# Patient Record
Sex: Male | Born: 1980 | Race: Black or African American | Hispanic: No | Marital: Single
Health system: Southern US, Community
[De-identification: ages and names within clinical notes are randomized; demographics above are authoritative.]

---

## 2006-07-18 ENCOUNTER — Ambulatory Visit (HOSPITAL_COMMUNITY): Admission: RE | Admit: 2006-07-18 | Discharge: 2006-07-18 | Payer: Self-pay | Admitting: Family Medicine

## 2017-04-26 ENCOUNTER — Emergency Department (HOSPITAL_COMMUNITY): Payer: No Typology Code available for payment source

## 2017-04-26 ENCOUNTER — Emergency Department (HOSPITAL_COMMUNITY)
Admission: EM | Admit: 2017-04-26 | Discharge: 2017-04-26 | Disposition: A | Discharge status: Other Acute Inpatient Hospital | Payer: No Typology Code available for payment source | Attending: Emergency Medicine | Admitting: Emergency Medicine

## 2017-04-26 DIAGNOSIS — Y939 Activity, unspecified: Secondary | ICD-10-CM | POA: Insufficient documentation

## 2017-04-26 DIAGNOSIS — Y92410 Unspecified street and highway as the place of occurrence of the external cause: Secondary | ICD-10-CM | POA: Diagnosis not present

## 2017-04-26 DIAGNOSIS — S12000A Unspecified displaced fracture of first cervical vertebra, initial encounter for closed fracture: Secondary | ICD-10-CM | POA: Diagnosis not present

## 2017-04-26 DIAGNOSIS — Y999 Unspecified external cause status: Secondary | ICD-10-CM | POA: Insufficient documentation

## 2017-04-26 DIAGNOSIS — S066X9A Traumatic subarachnoid hemorrhage with loss of consciousness of unspecified duration, initial encounter: Secondary | ICD-10-CM | POA: Diagnosis not present

## 2017-04-26 DIAGNOSIS — M532X1 Spinal instabilities, occipito-atlanto-axial region: Secondary | ICD-10-CM

## 2017-04-26 DIAGNOSIS — R0603 Acute respiratory distress: Secondary | ICD-10-CM | POA: Diagnosis not present

## 2017-04-26 DIAGNOSIS — I609 Nontraumatic subarachnoid hemorrhage, unspecified: Secondary | ICD-10-CM

## 2017-04-26 DIAGNOSIS — R4020 Unspecified coma: Secondary | ICD-10-CM | POA: Diagnosis present

## 2017-04-26 LAB — PREPARE FRESH FROZEN PLASMA
Unit division: 0
Unit division: 0

## 2017-04-26 LAB — URINALYSIS, ROUTINE W REFLEX MICROSCOPIC
Bilirubin Urine: NEGATIVE
Glucose, UA: 50 mg/dL — AB
Hgb urine dipstick: NEGATIVE
Ketones, ur: 5 mg/dL — AB
Leukocytes, UA: NEGATIVE
Nitrite: NEGATIVE
Protein, ur: NEGATIVE mg/dL
Specific Gravity, Urine: 1.016 (ref 1.005–1.030)
pH: 7 (ref 5.0–8.0)

## 2017-04-26 LAB — I-STAT CHEM 8, ED
BUN: 9 mg/dL (ref 6–20)
Calcium, Ion: 1.12 mmol/L — ABNORMAL LOW (ref 1.15–1.40)
Chloride: 100 mmol/L — ABNORMAL LOW (ref 101–111)
Creatinine, Ser: 0.9 mg/dL (ref 0.61–1.24)
Glucose, Bld: 215 mg/dL — ABNORMAL HIGH (ref 65–99)
HCT: 52 % (ref 39.0–52.0)
Hemoglobin: 17.7 g/dL — ABNORMAL HIGH (ref 13.0–17.0)
Potassium: 3.9 mmol/L (ref 3.5–5.1)
Sodium: 139 mmol/L (ref 135–145)
TCO2: 26 mmol/L (ref 22–32)

## 2017-04-26 LAB — COMPREHENSIVE METABOLIC PANEL
ALT: 23 U/L (ref 17–63)
AST: 34 U/L (ref 15–41)
Albumin: 4.2 g/dL (ref 3.5–5.0)
Alkaline Phosphatase: 69 U/L (ref 38–126)
Anion gap: 11 (ref 5–15)
BUN: 8 mg/dL (ref 6–20)
CO2: 24 mmol/L (ref 22–32)
Calcium: 8.6 mg/dL — ABNORMAL LOW (ref 8.9–10.3)
Chloride: 101 mmol/L (ref 101–111)
Creatinine, Ser: 1.03 mg/dL (ref 0.61–1.24)
GFR calc Af Amer: 49 mL/min — ABNORMAL LOW (ref >60)
GFR calc non Af Amer: 42 mL/min — ABNORMAL LOW (ref >60)
Glucose, Bld: 213 mg/dL — ABNORMAL HIGH (ref 65–99)
Potassium: 3.9 mmol/L (ref 3.5–5.1)
Sodium: 136 mmol/L (ref 135–145)
Total Bilirubin: 1.1 mg/dL (ref 0.3–1.2)
Total Protein: 7.2 g/dL (ref 6.5–8.1)

## 2017-04-26 LAB — CBC
HCT: 49.1 % (ref 39.0–52.0)
Hemoglobin: 17.2 g/dL — ABNORMAL HIGH (ref 13.0–17.0)
MCH: 31.6 pg (ref 26.0–34.0)
MCHC: 35 g/dL (ref 30.0–36.0)
MCV: 90.3 fL (ref 78.0–100.0)
Platelets: 226 10*3/uL (ref 150–400)
RBC: 5.44 MIL/uL (ref 4.22–5.81)
RDW: 12.4 % (ref 11.5–15.5)
WBC: 14.8 10*3/uL — ABNORMAL HIGH (ref 4.0–10.5)

## 2017-04-26 LAB — BPAM FFP
Blood Product Expiration Date: 201810122359
Blood Product Expiration Date: 201810122359
ISSUE DATE / TIME: 201810111400
ISSUE DATE / TIME: 201810111400
Unit Type and Rh: 2800
Unit Type and Rh: 8400

## 2017-04-26 LAB — PROTIME-INR
INR: 1.13
Prothrombin Time: 14.5 seconds (ref 11.4–15.2)

## 2017-04-26 LAB — CDS SEROLOGY

## 2017-04-26 LAB — I-STAT CG4 LACTIC ACID, ED: Lactic Acid, Venous: 3.22 mmol/L (ref 0.5–1.9)

## 2017-04-26 LAB — ABO/RH: ABO/RH(D): B POS

## 2017-04-26 LAB — ETHANOL: Alcohol, Ethyl (B): <10 mg/dL (ref <10)

## 2017-04-26 MED ORDER — ETOMIDATE 2 MG/ML IV SOLN
INTRAVENOUS | Status: AC | PRN
  Administered 2017-04-26: 20 mg via INTRAVENOUS

## 2017-04-26 MED ORDER — PROPOFOL 1000 MG/100ML IV EMUL
INTRAVENOUS | Status: AC
  Filled 2017-04-26: qty 100

## 2017-04-26 MED ORDER — ROCURONIUM BROMIDE 50 MG/5ML IV SOLN
INTRAVENOUS | Status: AC | PRN
  Administered 2017-04-26: 80 mg via INTRAVENOUS

## 2017-04-26 MED ORDER — PROPOFOL 1000 MG/100ML IV EMUL
INTRAVENOUS | Status: AC | PRN
  Administered 2017-04-26: 20 ug/kg/min via INTRAVENOUS

## 2017-04-26 MED ORDER — IOPAMIDOL (ISOVUE-300) INJECTION 61%
100.0000 mL | Freq: Once | INTRAVENOUS | Status: AC | PRN
  Administered 2017-04-26: 100 mL via INTRAVENOUS

## 2017-04-26 MED ORDER — SODIUM CHLORIDE 0.9 % IV SOLN
INTRAVENOUS | Status: AC | PRN
  Administered 2017-04-26: 2000 mL via INTRAVENOUS

## 2017-04-26 NOTE — ED Notes (Signed)
[facility] unable to transport patient. [facility]

## 2017-04-26 NOTE — ED Notes (Signed)
Pt taken to CT with this nurse and tech

## 2017-04-26 NOTE — ED Notes (Signed)
CG-4 reported to Dr. Kohut 
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
## 2017-04-26 NOTE — ED Notes (Signed)
X-ray at bedside

## 2017-04-26 NOTE — ED Notes (Signed)
Dr. Janee Morn on phone with [facility], neurosurgery
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
## 2017-04-26 NOTE — ED Notes (Signed)
20 etomidate
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
## 2017-04-26 NOTE — ED Provider Notes (Signed)
  Physical Exam  BP 113/86   Pulse 93   Temp (!) 96.3 F (35.7 C)   Resp 11   Ht  (1.854 m)   Wt 86.2 kg (190 lb)   SpO2 100%   BMI 25.07 kg/m   Physical Exam  ED Course  Procedures  MDM Patient was seen by trauma and neurosurgery. They recommend transfer to [facility]. Trauma called [facility] transfer line and Dr. Murrell Converse is the accepting doctor.      Charlynne Pander, MD 04/26/17 [phone]

## 2017-04-26 NOTE — ED Notes (Signed)
Sats 97%, pt has very stridor like audible breath sounds. Eyes open at this time. No movement.

## 2017-04-27 LAB — BPAM RBC
Blood Product Expiration Date: 201810182359
Blood Product Expiration Date: 201810182359
ISSUE DATE / TIME: 201810111359
ISSUE DATE / TIME: 201810111359
Unit Type and Rh: 9500
Unit Type and Rh: 9500

## 2017-04-27 LAB — TYPE AND SCREEN
ABO/RH(D): B POS
Antibody Screen: NEGATIVE
Unit division: 0
Unit division: 0

## 2017-05-21 MED ORDER — ACETAMINOPHEN 500 MG PO TABS
500.00 mg | ORAL_TABLET | ORAL | Status: DC
Start: 2017-05-21 — End: 2017-05-21

## 2017-05-21 MED ORDER — GABAPENTIN 300 MG PO CAPS
900.00 mg | ORAL_CAPSULE | ORAL | Status: DC
Start: 2017-05-21 — End: 2017-05-21

## 2017-05-21 MED ORDER — BISACODYL 10 MG RE SUPP
10.00 mg | RECTAL | Status: DC
Start: ? — End: 2017-05-21

## 2017-05-21 MED ORDER — GENERIC EXTERNAL MEDICATION
3.38 g | Status: DC
Start: 2017-05-21 — End: 2017-05-21

## 2017-05-21 MED ORDER — PROPRANOLOL HCL 10 MG PO TABS
10.00 mg | ORAL_TABLET | ORAL | Status: DC
Start: 2017-05-21 — End: 2017-05-21

## 2017-05-21 MED ORDER — POLYETHYLENE GLYCOL 3350 17 G PO PACK
17.00 g | PACK | ORAL | Status: DC
Start: 2017-05-22 — End: 2017-05-21

## 2017-05-21 MED ORDER — TRAMADOL HCL 50 MG PO TABS
50.00 mg | ORAL_TABLET | ORAL | Status: DC
Start: ? — End: 2017-05-21

## 2017-05-21 MED ORDER — GENERIC EXTERNAL MEDICATION
Status: DC
Start: 2017-05-21 — End: 2017-05-21

## 2017-05-21 MED ORDER — METHOCARBAMOL 500 MG PO TABS
500.00 mg | ORAL_TABLET | ORAL | Status: DC
Start: 2017-05-21 — End: 2017-05-21

## 2017-05-21 MED ORDER — ENOXAPARIN SODIUM 30 MG/0.3ML ~~LOC~~ SOLN
30.00 mg | SUBCUTANEOUS | Status: DC
Start: 2017-05-21 — End: 2017-05-21

## 2017-05-21 MED ORDER — CHLORHEXIDINE GLUCONATE 0.12 % MT SOLN
15.00 mL | OROMUCOSAL | Status: DC
Start: 2017-05-21 — End: 2017-05-21

## 2017-05-21 MED ORDER — MAGNESIUM OXIDE 400 MG PO TABS
400.00 mg | ORAL_TABLET | ORAL | Status: DC
Start: 2017-05-21 — End: 2017-05-21

## 2017-05-21 MED ORDER — ASPIRIN 81 MG PO CHEW
81.00 mg | CHEWABLE_TABLET | ORAL | Status: DC
Start: 2017-05-22 — End: 2017-05-21

## 2017-05-21 MED ORDER — SENNOSIDES-DOCUSATE SODIUM 8.6-50 MG PO TABS
2.00 | ORAL_TABLET | ORAL | Status: DC
Start: 2017-05-21 — End: 2017-05-21

## 2017-05-21 MED ORDER — GENERIC EXTERNAL MEDICATION
1.25 g | Status: DC
Start: 2017-05-21 — End: 2017-05-21

## 2017-05-21 MED ORDER — AMLODIPINE BESYLATE 5 MG PO TABS
10.00 mg | ORAL_TABLET | ORAL | Status: DC
Start: 2017-05-22 — End: 2017-05-21

## 2017-05-21 MED ORDER — OXYCODONE-ACETAMINOPHEN 5-325 MG PO TABS
1.00 | ORAL_TABLET | ORAL | Status: DC
Start: ? — End: 2017-05-21
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
